# Patient Record
Sex: Male | Born: 1945 | Race: Black or African American | Hispanic: No | Marital: Single | Smoking: Former smoker
Health system: Southern US, Community
[De-identification: ages and names within clinical notes are randomized; demographics above are authoritative.]

## PROBLEM LIST (undated history)

## (undated) DIAGNOSIS — F32A Depression, unspecified: Secondary | ICD-10-CM

## (undated) DIAGNOSIS — F4310 Post-traumatic stress disorder, unspecified: Secondary | ICD-10-CM

## (undated) DIAGNOSIS — Z87891 Personal history of nicotine dependence: Secondary | ICD-10-CM

## (undated) DIAGNOSIS — R05 Cough: Secondary | ICD-10-CM

## (undated) DIAGNOSIS — R053 Chronic cough: Secondary | ICD-10-CM

## (undated) DIAGNOSIS — F329 Major depressive disorder, single episode, unspecified: Secondary | ICD-10-CM

## (undated) HISTORY — PX: NO PAST SURGERIES: SHX2092

## (undated) HISTORY — DX: Major depressive disorder, single episode, unspecified: F32.9

## (undated) HISTORY — DX: Post-traumatic stress disorder, unspecified: F43.10

## (undated) HISTORY — DX: Cough: R05

## (undated) HISTORY — DX: Chronic cough: R05.3

## (undated) HISTORY — DX: Personal history of nicotine dependence: Z87.891

## (undated) HISTORY — DX: Depression, unspecified: F32.A

---

## 2001-03-18 ENCOUNTER — Ambulatory Visit (HOSPITAL_COMMUNITY): Admission: RE | Admit: 2001-03-18 | Discharge: 2001-03-18 | Payer: Self-pay | Admitting: Orthopedic Surgery

## 2001-03-18 ENCOUNTER — Encounter: Payer: Self-pay | Admitting: Orthopedic Surgery

## 2002-03-28 ENCOUNTER — Encounter: Payer: Self-pay | Admitting: Neurology

## 2002-03-28 ENCOUNTER — Encounter: Admission: RE | Admit: 2002-03-28 | Discharge: 2002-03-28 | Payer: Self-pay | Admitting: Neurology

## 2002-05-04 ENCOUNTER — Ambulatory Visit (HOSPITAL_BASED_OUTPATIENT_CLINIC_OR_DEPARTMENT_OTHER): Admission: RE | Admit: 2002-05-04 | Discharge: 2002-05-04 | Payer: Self-pay | Admitting: Internal Medicine

## 2003-01-28 ENCOUNTER — Ambulatory Visit (HOSPITAL_BASED_OUTPATIENT_CLINIC_OR_DEPARTMENT_OTHER): Admission: RE | Admit: 2003-01-28 | Discharge: 2003-01-28 | Payer: Self-pay | Admitting: Pulmonary Disease

## 2004-02-28 ENCOUNTER — Ambulatory Visit: Payer: Self-pay | Admitting: Internal Medicine

## 2004-04-18 ENCOUNTER — Ambulatory Visit: Payer: Self-pay | Admitting: Internal Medicine

## 2004-05-03 ENCOUNTER — Ambulatory Visit: Payer: Self-pay | Admitting: Internal Medicine

## 2004-05-18 ENCOUNTER — Ambulatory Visit: Payer: Self-pay | Admitting: Internal Medicine

## 2004-05-25 ENCOUNTER — Ambulatory Visit (HOSPITAL_COMMUNITY): Admission: RE | Admit: 2004-05-25 | Discharge: 2004-05-25 | Payer: Self-pay | Admitting: Internal Medicine

## 2004-07-18 ENCOUNTER — Ambulatory Visit: Payer: Self-pay | Admitting: Internal Medicine

## 2004-07-20 ENCOUNTER — Ambulatory Visit (HOSPITAL_COMMUNITY): Admission: RE | Admit: 2004-07-20 | Discharge: 2004-07-20 | Payer: Self-pay | Admitting: Internal Medicine

## 2005-01-18 ENCOUNTER — Ambulatory Visit: Payer: Self-pay | Admitting: Internal Medicine

## 2005-02-07 ENCOUNTER — Ambulatory Visit: Payer: Self-pay | Admitting: Pulmonary Disease

## 2005-03-15 ENCOUNTER — Ambulatory Visit: Payer: Self-pay | Admitting: Pulmonary Disease

## 2005-04-17 ENCOUNTER — Ambulatory Visit: Payer: Self-pay | Admitting: Internal Medicine

## 2005-05-03 ENCOUNTER — Ambulatory Visit: Payer: Self-pay | Admitting: Endocrinology

## 2005-10-02 ENCOUNTER — Ambulatory Visit: Payer: Self-pay | Admitting: Internal Medicine

## 2005-11-22 ENCOUNTER — Ambulatory Visit: Payer: Self-pay | Admitting: Internal Medicine

## 2005-11-28 ENCOUNTER — Ambulatory Visit: Payer: Self-pay | Admitting: Internal Medicine

## 2005-12-20 ENCOUNTER — Ambulatory Visit: Payer: Self-pay | Admitting: Internal Medicine

## 2006-05-21 ENCOUNTER — Ambulatory Visit: Payer: Self-pay | Admitting: Internal Medicine

## 2006-10-31 DIAGNOSIS — E785 Hyperlipidemia, unspecified: Secondary | ICD-10-CM | POA: Insufficient documentation

## 2006-10-31 DIAGNOSIS — R51 Headache: Secondary | ICD-10-CM

## 2006-10-31 DIAGNOSIS — F3289 Other specified depressive episodes: Secondary | ICD-10-CM | POA: Insufficient documentation

## 2006-10-31 DIAGNOSIS — F329 Major depressive disorder, single episode, unspecified: Secondary | ICD-10-CM

## 2006-10-31 DIAGNOSIS — M109 Gout, unspecified: Secondary | ICD-10-CM

## 2006-10-31 DIAGNOSIS — R519 Headache, unspecified: Secondary | ICD-10-CM | POA: Insufficient documentation

## 2007-04-23 ENCOUNTER — Ambulatory Visit: Payer: Self-pay | Admitting: Internal Medicine

## 2007-04-23 DIAGNOSIS — M79609 Pain in unspecified limb: Secondary | ICD-10-CM

## 2007-04-23 DIAGNOSIS — J309 Allergic rhinitis, unspecified: Secondary | ICD-10-CM | POA: Insufficient documentation

## 2011-12-04 ENCOUNTER — Encounter (HOSPITAL_COMMUNITY): Payer: Self-pay | Admitting: *Deleted

## 2011-12-04 ENCOUNTER — Emergency Department (INDEPENDENT_AMBULATORY_CARE_PROVIDER_SITE_OTHER)
Admission: EM | Admit: 2011-12-04 | Discharge: 2011-12-04 | Disposition: A | Discharge status: Home / Self Care | Payer: Federal, State, Local not specified - PPO | Source: Home / Self Care | Attending: Emergency Medicine | Admitting: Emergency Medicine

## 2011-12-04 DIAGNOSIS — M25519 Pain in unspecified shoulder: Secondary | ICD-10-CM

## 2011-12-04 DIAGNOSIS — S239XXA Sprain of unspecified parts of thorax, initial encounter: Secondary | ICD-10-CM

## 2011-12-04 DIAGNOSIS — S29012A Strain of muscle and tendon of back wall of thorax, initial encounter: Secondary | ICD-10-CM

## 2011-12-04 DIAGNOSIS — M25511 Pain in right shoulder: Secondary | ICD-10-CM

## 2011-12-04 LAB — POCT I-STAT, CHEM 8
BUN: 12 mg/dL (ref 6–23)
Calcium, Ion: 1.19 mmol/L (ref 1.13–1.30)
Chloride: 101 mEq/L (ref 96–112)
Creatinine, Ser: 0.9 mg/dL (ref 0.50–1.35)
Glucose, Bld: 107 mg/dL — ABNORMAL HIGH (ref 70–99)
HCT: 44 % (ref 39.0–52.0)
Hemoglobin: 15 g/dL (ref 13.0–17.0)
Potassium: 3.8 mEq/L (ref 3.5–5.1)
Sodium: 141 mEq/L (ref 135–145)
TCO2: 27 mmol/L (ref 0–100)

## 2011-12-04 MED ORDER — PREDNISONE 20 MG PO TABS
ORAL_TABLET | ORAL | Status: AC
  Filled 2011-12-04: qty 3

## 2011-12-04 MED ORDER — CELECOXIB 200 MG PO CAPS: 200.0000 mg | ORAL_CAPSULE | Freq: Two times a day (BID) | ORAL | Status: AC

## 2011-12-04 MED ORDER — OMEPRAZOLE 20 MG PO CPDR: 20.0000 mg | DELAYED_RELEASE_CAPSULE | Freq: Every day | ORAL | Status: DC

## 2011-12-04 MED ORDER — PREDNISONE 50 MG PO TABS: 50.0000 mg | ORAL_TABLET | Freq: Every day | ORAL | Status: DC

## 2011-12-04 MED ORDER — PREDNISONE 20 MG PO TABS
60.0000 mg | ORAL_TABLET | Freq: Once | ORAL | Status: AC
  Administered 2011-12-04: 60 mg via ORAL

## 2011-12-04 MED ORDER — KETOROLAC TROMETHAMINE 60 MG/2ML IM SOLN
60.0000 mg | Freq: Once | INTRAMUSCULAR | Status: AC
  Administered 2011-12-04: 60 mg via INTRAMUSCULAR

## 2011-12-04 MED ORDER — KETOROLAC TROMETHAMINE 60 MG/2ML IM SOLN
INTRAMUSCULAR | Status: AC
  Filled 2011-12-04: qty 2

## 2011-12-04 NOTE — ED Notes (Signed)
1 1/2 months of right shoulder pain--denies recent injury

## 2011-12-04 NOTE — ED Provider Notes (Signed)
Medical screening examination/treatment/procedure(s) were performed by non-physician practitioner and as supervising physician I was immediately available for consultation/collaboration.  Luiz Blare MD   Luiz Blare, MD 12/04/11 [phone]

## 2011-12-04 NOTE — ED Provider Notes (Signed)
History     CSN: [id number]  Arrival date & time 12/04/11  1609   None     Chief Complaint  Patient presents with  . Shoulder Pain    (Consider location/radiation/quality/duration/timing/severity/associated sxs/prior treatment) The history is provided by the patient.   David Wilkins is a 66 y.o. male who complains of right shoulder pain for one month.  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  Symptoms have been persistent, minimal improvement with home OTC pain reliever.  Denies prior history of related problems.    History reviewed. No pertinent past medical history.  History reviewed. No pertinent past surgical history.  History reviewed. No pertinent family history.  History  Substance Use Topics  . Smoking status: Former Games developer  . Smokeless tobacco: Not on file  . Alcohol Use: Yes     occasionaly      Review of Systems  Constitutional: Negative.   Respiratory: Negative.   Cardiovascular: Negative.   Musculoskeletal: Positive for arthralgias. Negative for myalgias, back pain, joint swelling and gait problem.  Neurological: Negative.     Allergies  Review of patient's allergies indicates no known allergies.  Home Medications   Current Outpatient Rx  Name Route Sig Dispense Refill  . CELECOXIB 200 MG PO CAPS Oral Take 1 capsule (200 mg total) by mouth 2 (two) times daily. 30 capsule 1  . OMEPRAZOLE 20 MG PO CPDR Oral Take 1 capsule (20 mg total) by mouth daily. 30 capsule 1  . PREDNISONE 50 MG PO TABS Oral Take 1 tablet (50 mg total) by mouth daily. 4 tablet 0    BP 121/77  Pulse 78  Temp 98.4 F (36.9 C) (Oral)  Resp 20  SpO2 96%  Physical Exam  Nursing note and vitals reviewed. Constitutional: He is oriented to person, place, and time. Vital signs are normal. He appears well-developed and well-nourished. He is active and cooperative.  HENT:  Head: Normocephalic.  Eyes:  Conjunctivae are normal. Pupils are equal, round, and reactive to light. No scleral icterus.  Neck: Trachea normal. Neck supple.  Cardiovascular: Normal rate, regular rhythm, normal heart sounds and intact distal pulses.   Pulmonary/Chest: Effort normal and breath sounds normal. No respiratory distress. He has no wheezes. He has no rales. He exhibits no tenderness.  Musculoskeletal:       Right shoulder: Normal.       Left shoulder: Normal.       Cervical back: Normal.       Thoracic back: Normal.       Back:       Tenderness upon palpation to the left of right scapula  Lymphadenopathy:    He has no cervical adenopathy.  Neurological: He is alert and oriented to person, place, and time. He has normal strength. No cranial nerve deficit or sensory deficit. Coordination and gait normal. GCS eye subscore is 4. GCS verbal subscore is 5. GCS motor subscore is 6.  Skin: Skin is warm and dry.  Psychiatric: He has a normal mood and affect. His speech is normal and behavior is normal. Judgment and thought content normal. Cognition and memory are normal.    ED Course  Procedures (including critical care time)  Labs Reviewed  POCT I-STAT, CHEM 8 - Abnormal; Notable for the following:    Glucose, Bld 107 (*)     All other components within normal limits   No results found.  1. Rhomboid muscle strain   2. Right shoulder pain       MDM  Continue with applying heat.  Take medication as prescribed.  No heavy lifting for one week.  Use proper lifting techniques.  Return if symptoms are not improved or if symptoms worsen.          Johnsie Kindred, NP 12/04/11 2053

## 2012-06-03 ENCOUNTER — Encounter (HOSPITAL_COMMUNITY): Payer: Self-pay | Admitting: *Deleted

## 2012-06-03 ENCOUNTER — Emergency Department (HOSPITAL_COMMUNITY)
Admission: EM | Admit: 2012-06-03 | Discharge: 2012-06-03 | Disposition: A | Discharge status: Home / Self Care | Payer: Federal, State, Local not specified - PPO | Source: Home / Self Care

## 2012-06-03 DIAGNOSIS — H10029 Other mucopurulent conjunctivitis, unspecified eye: Secondary | ICD-10-CM

## 2012-06-03 MED ORDER — TOBRAMYCIN-DEXAMETHASONE 0.3-0.1 % OP OINT: TOPICAL_OINTMENT | Freq: Four times a day (QID) | OPHTHALMIC | Status: DC

## 2012-06-03 NOTE — ED Provider Notes (Signed)
History     CSN: [id number]  Arrival date & time 06/03/12  1647   First MD Initiated Contact with Patient 06/03/12 1709      Chief Complaint  Patient presents with  . Conjunctivitis    (Consider location/radiation/quality/duration/timing/severity/associated sxs/prior treatment) HPI Comments: 67 year old man presents with redness, drainage ,swelling and purulent exudates in the left eye. It started late yesterday afternoon. States it is worse when he lies down. He denies any known injury.he states there is minimal blurring in the left eye as compared to the normal light. No foreign body sensation, no photophobia.  Patient is a 67 y.o. male presenting with conjunctivitis.  Conjunctivitis  Associated symptoms include congestion, eye discharge and eye redness. Pertinent negatives include no ear discharge, no rhinorrhea, no sore throat and no neck pain.    History reviewed. No pertinent past medical history.  History reviewed. No pertinent past surgical history.  History reviewed. No pertinent family history.  History  Substance Use Topics  . Smoking status: Former Games developer  . Smokeless tobacco: Not on file  . Alcohol Use: Yes     Comment: occasionaly      Review of Systems  Constitutional: Negative.   HENT: Positive for congestion. Negative for sore throat, facial swelling, rhinorrhea, neck pain and ear discharge.   Eyes: Positive for discharge and redness.  Gastrointestinal: Negative.   Skin: Negative.   Psychiatric/Behavioral: Negative.     Allergies  Review of patient's allergies indicates no known allergies.  Home Medications   Current Outpatient Rx  Name  Route  Sig  Dispense  Refill  . omeprazole (PRILOSEC) 20 MG capsule   Oral   Take 1 capsule (20 mg total) by mouth daily.   30 capsule   1   . predniSONE (DELTASONE) 50 MG tablet   Oral   Take 1 tablet (50 mg total) by mouth daily.   4 tablet   0   . tobramycin-dexamethasone (TOBRADEX) ophthalmic  ointment   Left Eye   Place into the left eye every 6 (six) hours.   3.5 g   0     BP 118/72  Pulse 72  Temp(Src) 98.6 F (37 C) (Oral)  SpO2 100%  Physical Exam  Nursing note and vitals reviewed. Constitutional: He is oriented to person, place, and time. He appears well-developed. No distress.  HENT:  Mouth/Throat: Oropharynx is clear and moist. No oropharyngeal exudate.  Eyes: EOM are normal. Pupils are equal, round, and reactive to light.  Conjunctiva red, and puffy upper and lower eyelids. Sclera is injected. There is a mucopurulent discharge from the eye.  Neck: Neck supple.  Cardiovascular: Normal rate.   Pulmonary/Chest: Effort normal.  Lymphadenopathy:    He has no cervical adenopathy.  Neurological: He is alert and oriented to person, place, and time.  Skin: Skin is warm and dry.  Psychiatric: He has a normal mood and affect.    ED Course  Procedures (including critical care time)  Labs Reviewed - No data to display No results found.   1. Acute bacterial conjunctivitis, left       MDM  Warm compresses to the eye several times during the day and upon awakening in the morning. They should get the cough that you are using is washed repeatedly. Wash your hands frequently. TobraDex ointment in the lower lid of the IV every 6 hours. He started getting symptoms in the right I start using the ointment the same way every 6 hours. You develop new  symptoms or getting worse may return otherwise she can follow up with your primary care doctor.        Hayden Rasmussen, NP 06/03/12 1815

## 2012-06-03 NOTE — ED Notes (Signed)
Pt  States  He     Woke  Up  With  Red irritated    l  Eye      2  Days  Ago   He   denys  Any  Other  Symptoms

## 2012-06-05 NOTE — ED Provider Notes (Signed)
Medical screening examination/treatment/procedure(s) were performed by resident physician or non-physician practitioner and as supervising physician I was immediately available for consultation/collaboration.   Myeesha Shane DOUGLAS MD.   Fabion Gatson D Gerrald Basu, MD 06/05/12 1003 

## 2012-10-30 ENCOUNTER — Encounter: Payer: Self-pay | Admitting: Medical

## 2012-10-30 ENCOUNTER — Ambulatory Visit (INDEPENDENT_AMBULATORY_CARE_PROVIDER_SITE_OTHER): Payer: Federal, State, Local not specified - PPO | Admitting: Medical

## 2012-10-30 VITALS — BP 130/90 | HR 100 | Temp 98.1°F | Resp 16 | Wt 191.0 lb

## 2012-10-30 DIAGNOSIS — R05 Cough: Secondary | ICD-10-CM

## 2012-10-30 DIAGNOSIS — Z87891 Personal history of nicotine dependence: Secondary | ICD-10-CM

## 2012-10-30 MED ORDER — OMEPRAZOLE 20 MG PO CPDR: 20.0000 mg | DELAYED_RELEASE_CAPSULE | Freq: Every day | ORAL | Status: DC

## 2012-10-30 NOTE — Progress Notes (Signed)
Subjective: Was seeing [facility] prior for care.  He is a [place] Veteran.  He lives alone and going to [facility] is costly.  Wants care closer to home.   Here for cough.  Has had cough for a couple of years.   Most of the time when he puts something in his mouth, will have a break out of uncontrollable cough, sneezing.  He brought a sucker to demonstrate this today.   Placing a dum dum sucker in his mouth prompted coughing fit about 2 minutes later.   He notes that [place] doctors were not sure what has been causing his cough.  He was referred to pulmonology, but hasn't went yet.  Last CXR not sure, but thinks it was in the last several months.  Last PFT several months ago.   Denies itchy and watery eyes, no sore throat, no ear pain, no hemoptysis, no abdominal pain, no NVD, no fever, no weight loss, no chest pain.   Has been prescribed inhalers, pills, not sure what the medications were.     Past Medical History  Diagnosis Date  . Depression   . Former smoker    ROS as in subjective  Objective: Filed Vitals:   10/30/12 1140  BP: 130/90  Pulse: 100  Temp: 98.1 F (36.7 C)  Resp: 16    General appearance: alert, no distress, WD/WN, pleasant AA male HEENT: normocephalic, sclerae anicteric, TMs pearly, nares patent, no discharge or erythema, pharynx normal Oral cavity: MMM, no lesions Neck: supple, no lymphadenopathy, no thyromegaly, no masses Heart: RRR, normal S1, S2, no murmurs Lungs: CTA bilaterally, no wheezes, rhonchi, or rales Abdomen: +bs, soft, non tender, non distended, no masses, no hepatomegaly, no splenomegaly Pulses: 2+ symmetric, upper and lower extremities, normal cap refill Ext: no edema   Assessment: Encounter Diagnoses  Name Primary?  . Chronic cough Yes  . Former smoker     Plan: We will request prior records, CXR, PFTs from the [place].  For now, begin Zyrtec at bedtime.  Begin Omeprazole daily in the morning.  Discussed possible causes of chronic cough.  Follow-up 2  wk, pending records.

## 2012-10-30 NOTE — Patient Instructions (Addendum)
Begin Zyrtec 10mg  OTC at bedtime for possible allergies.   Begin Omeprazole20mg  tablet daily 45 minutes before breakfast for possible acid reflux.  Lets get your records from the [place].  I would like to see you back in 2 weeks.     Cough, Adult  A cough is a reflex that helps clear your throat and airways. It can help heal the body or may be a reaction to an irritated airway. A cough may only last 2 or 3 weeks (acute) or may last more than 8 weeks (chronic).  CAUSES Acute cough:  Viral or bacterial infections. Chronic cough:  Infections.  Allergies.  Asthma.  Post-nasal drip.  Smoking.  Heartburn or acid reflux.  Some medicines.  Chronic lung problems (COPD).  Cancer. SYMPTOMS   Cough.  Fever.  Chest pain.  Increased breathing rate.  High-pitched whistling sound when breathing (wheezing).  Colored mucus that you cough up (sputum). TREATMENT   A bacterial cough may be treated with antibiotic medicine.  A viral cough must run its course and will not respond to antibiotics.  Your caregiver may recommend other treatments if you have a chronic cough. HOME CARE INSTRUCTIONS   Only take over-the-counter or prescription medicines for pain, discomfort, or fever as directed by your caregiver. Use cough suppressants only as directed by your caregiver.  Use a cold steam vaporizer or humidifier in your bedroom or home to help loosen secretions.  Sleep in a semi-upright position if your cough is worse at night.  Rest as needed.  Stop smoking if you smoke. SEEK IMMEDIATE MEDICAL CARE IF:   You have pus in your sputum.  Your cough starts to worsen.  You cannot control your cough with suppressants and are losing sleep.  You begin coughing up blood.  You have difficulty breathing.  You develop pain which is getting worse or is uncontrolled with medicine.  You have a fever. MAKE SURE YOU:   Understand these instructions.  Will watch your condition.  Will  get help right away if you are not doing well or get worse. Document Released: 09/21/2010 Document Revised: 06/17/2011 Document Reviewed: 09/21/2010 [facility] Patient Information 2014 [place], [place].

## 2012-11-02 ENCOUNTER — Telehealth: Payer: Self-pay | Admitting: Internal Medicine

## 2012-11-02 NOTE — Telephone Encounter (Signed)
Pt notified of shane's recommendations and will call back in 2 weeks to follow-up

## 2012-11-02 NOTE — Telephone Encounter (Signed)
Message copied by Joslyn Hy on Mon Nov 02, 2012 12:35 PM ------      Message from: Jac Canavan      Created: Mon Nov 02, 2012  7:23 AM       I reviewed his prior medical records.  At this point, his last chest xray a year ago was ok.   He has had prior bronchoscopy of his lungs.               I recommend he begin the Omeprazole/Prilosec prescription once daily in the morning 45 min before breakfast, begin Zyrtec 10mg  at bedtime and f/u in 2wk. ------

## 2012-11-05 ENCOUNTER — Encounter: Payer: Self-pay | Admitting: Medical

## 2012-11-11 ENCOUNTER — Ambulatory Visit (INDEPENDENT_AMBULATORY_CARE_PROVIDER_SITE_OTHER): Payer: Federal, State, Local not specified - PPO | Admitting: Medical

## 2012-11-11 ENCOUNTER — Encounter: Payer: Self-pay | Admitting: Medical

## 2012-11-11 VITALS — BP 118/80 | HR 80 | Temp 98.6°F | Resp 16 | Wt 192.0 lb

## 2012-11-11 DIAGNOSIS — R05 Cough: Secondary | ICD-10-CM

## 2012-11-11 DIAGNOSIS — R21 Rash and other nonspecific skin eruption: Secondary | ICD-10-CM

## 2012-11-11 MED ORDER — TRIAMCINOLONE ACETONIDE 0.1 % EX CREA: TOPICAL_CREAM | Freq: Two times a day (BID) | CUTANEOUS | Status: DC

## 2012-11-11 NOTE — Progress Notes (Signed)
Subjective: Here for recheck on chronic cough. Was seeing [facility] prior for care.  He is a [place] Veteran.  He lives alone.  Here for cough.  Has had cough for a couple of years.   Most of the time when he puts something in his mouth, will have a break out of uncontrollable cough, sneezing.  He notes that [place] doctors were not sure what has been causing his cough.  He has seen pulmonology prior at the [place].  He [facility], but hasn't went yet.  Last CXR not sure, but thinks it was in the last several months.  Last PFT several months ago.   Denies itchy and watery eyes, no sore throat, no ear pain, no hemoptysis, no abdominal pain, no NVD, no fever, no weight loss, no chest pain.   Has been prescribed inhalers, pills, not sure what the medications were.    Since last visit he did begin both Omeprazole and Zyrtec which gave about 40% improvement.    He notes hx/o rash.  Gets this intermittent.  Can be itchy.  Can flare from time to time for many years.  No one has every diagnosed it.  It comes and goes.  Wonders if it is due to exposures back in [place].  Past Medical History  Diagnosis Date  . Depression   . Former smoker   . Chronic cough    ROS as in subjective  Objective: Filed Vitals:   11/11/12 1538  BP: 118/80  Pulse: 80  Temp: 98.6 F (37 C)  Resp: 16    General appearance: alert, no distress, WD/WN, pleasant AA male HEENT: normocephalic, sclerae anicteric, TMs pearly, nares patent, no discharge or erythema, pharynx normal Oral cavity: MMM, no lesions Neck: supple, no lymphadenopathy, no thyromegaly, no masses Heart: RRR, normal S1, S2, no murmurs Lungs: CTA bilaterally, no wheezes, rhonchi, or rales Abdomen: +bs, soft, non tender, non distended, no masses, no hepatomegaly, no splenomegaly Pulses: 2+ symmetric, upper and lower extremities, normal cap refill Ext: no edema Skin: few scattered roundish patches of rough skin, slight red/brown  appearance, on left hand, left lower leg, suggestive of nummular eczema  Assessment: Encounter Diagnoses  Name Primary?  . Chronic cough Yes  . Rash and nonspecific skin eruption     Plan: Reviewed prior records including normal CXR 1 year ago, chart records showing prior negative bronchoscopy, documented longstanding chronic cough.  I didn't get very much prior records.   He has had some improvement on Zyrtec and Omeprazole which he will continue.    We will refer to local pulmonologist here for further evaluation.  Rash - suggestive of nummular eczema.   Begin triamcinolone cream.  C/t daily lotion.   If not improving in 1-2 wk, return for biopsy.   F/u pending referral

## 2012-11-17 ENCOUNTER — Telehealth: Payer: Self-pay | Admitting: Family Medicine

## 2012-11-17 NOTE — Telephone Encounter (Signed)
Message copied by Janeice Robinson on Tue Nov 17, 2012  1:51 PM ------      Message from: Jac Canavan      Created: Wed Nov 11, 2012  9:57 PM       Refer to pulmonology for chronic cough.  Refer to Dr. Shelle Iron ------

## 2012-11-17 NOTE — Telephone Encounter (Signed)
Patient is aware of his appointment to see Dr. Shelle Iron on 12/21/12 @ 245 pm. CLS

## 2012-11-18 ENCOUNTER — Telehealth: Payer: Self-pay | Admitting: Family Medicine

## 2012-11-18 NOTE — Telephone Encounter (Signed)
Patient is aware of his appointment to see Dr. Shelle Iron at [facility] on 12/21/12 @ 245 pm. CLS

## 2012-12-02 ENCOUNTER — Ambulatory Visit (INDEPENDENT_AMBULATORY_CARE_PROVIDER_SITE_OTHER): Payer: Federal, State, Local not specified - PPO | Admitting: Medical

## 2012-12-02 ENCOUNTER — Encounter: Payer: Self-pay | Admitting: Medical

## 2012-12-02 VITALS — BP 100/70 | HR 92 | Temp 97.6°F | Resp 16 | Wt 196.0 lb

## 2012-12-02 DIAGNOSIS — M79609 Pain in unspecified limb: Secondary | ICD-10-CM

## 2012-12-02 DIAGNOSIS — M79645 Pain in left finger(s): Secondary | ICD-10-CM

## 2012-12-02 DIAGNOSIS — M65839 Other synovitis and tenosynovitis, unspecified forearm: Secondary | ICD-10-CM

## 2012-12-02 DIAGNOSIS — M25542 Pain in joints of left hand: Secondary | ICD-10-CM

## 2012-12-02 DIAGNOSIS — M659 Synovitis and tenosynovitis, unspecified: Secondary | ICD-10-CM

## 2012-12-02 DIAGNOSIS — M25549 Pain in joints of unspecified hand: Secondary | ICD-10-CM

## 2012-12-02 MED ORDER — NAPROXEN SODIUM ER 500 MG PO TB24: 500.0000 mg | ORAL_TABLET | Freq: Every day | ORAL | Status: DC

## 2012-12-02 NOTE — Patient Instructions (Signed)
Thumb pain, inflammation and tenosynovitis.  Begin Naprelan 750mg , 1 tablet daily for 2 days, then Use Naprelan 500mg , 1 tablet daily for 4 days  Use ice for swelling and pain  Use the Thumb Spica Splint for compression and rest  Rest the hand.  If not improving by mid next week, then let me know.

## 2012-12-02 NOTE — Progress Notes (Signed)
Subjective: Here for left thumb pain.   He was using a long handle brush to clean his siding Saturday 4 days ago.   Since then has had pain and swelling of left thumb, decreased ROM.  Denies trauma or other injury.   No other c/o.   Using nothing for symptoms.  No weakness, numbness, tingling.   ROS as in subjective  Objective: Gen: wd, wn, nad Skin: mild generalized swelling of left thumb MCP, no warmth or erythema MSK: swelling and tenderness at left thumb MCP, decreased ROM due to pain and swelling, otherwise no deformity, rest of hand and fingers nontender and normal ROM Thumb neurovascularly intact  Assessment: Encounter Diagnoses  Name Primary?  . Thumb pain, left Yes  . Tenosynovitis of thumb   . Arthralgia of hand, left    Plan: Left thumb with pain swelling, tenosynovitis.  Begin thumb spica splint, ice, rest, short course of daily Naprelan samples x 5 days, and recheck if not much improved by mid next week.

## 2012-12-21 ENCOUNTER — Encounter: Payer: Self-pay | Admitting: Pulmonary Disease

## 2012-12-21 ENCOUNTER — Ambulatory Visit (INDEPENDENT_AMBULATORY_CARE_PROVIDER_SITE_OTHER): Payer: Federal, State, Local not specified - PPO | Admitting: Pulmonary Disease

## 2012-12-21 VITALS — BP 110/80 | HR 80 | Temp 97.4°F | Ht 71.0 in | Wt 190.2 lb

## 2012-12-21 DIAGNOSIS — R05 Cough: Secondary | ICD-10-CM

## 2012-12-21 MED ORDER — OMEPRAZOLE 40 MG PO CPDR: 40.0000 mg | DELAYED_RELEASE_CAPSULE | Freq: Two times a day (BID) | ORAL | Status: DC

## 2012-12-21 MED ORDER — BENZONATATE 100 MG PO CAPS: 200.0000 mg | ORAL_CAPSULE | Freq: Four times a day (QID) | ORAL | Status: DC | PRN

## 2012-12-21 NOTE — Addendum Note (Signed)
Addended by: Darrell Jewel on: 12/21/2012 04:16 PM   Modules accepted: Orders

## 2012-12-21 NOTE — Assessment & Plan Note (Signed)
The patient has a chronic cough of over 2 years duration, and most likely based on his history, it is upper airway in origin.  He is having nasal sniffling during our visit, as well as frequent throat clearing.  He also describes a classic globus sensation.  His spirometry today is unremarkable.  At this point, I would like to treat him aggressively for postnasal drip as well as laryngo-pharyngeal reflux.  I have also discussed with him the behavioral therapies for cyclical coughing.

## 2012-12-21 NOTE — Progress Notes (Signed)
  Subjective:    Patient ID: David Wilkins, male    DOB: [birth year], 67 y.o.   MRN: [id number]  HPI The patient is a 67 year old male who been asked to see for chronic cough.  The patient states he's had this for at least 2 years, and feels that it has somewhat worsened.  It is dry and hacking in nature, and is worse upon lying down.  The patient clearly has rhinorrhea symptoms, but is unsure if he has had postnasal drip.  He denies reflux symptoms.  He describes a classic globus sensation, and will clear his throat frequently.  He denies any shortness of breath, and has had a recent chest x-ray at the [facility] and was told that it was unremarkable.  He does have a history of smoking one pack per day for 18 years, but has not smoked since 1984.   Review of Systems  Constitutional: Negative for fever and unexpected weight change.  HENT: Positive for sneezing. Negative for ear pain, nosebleeds, congestion, sore throat, rhinorrhea, trouble swallowing, dental problem, postnasal drip and sinus pressure.   Eyes: Negative for redness and itching.  Respiratory: Positive for cough. Negative for chest tightness, shortness of breath and wheezing.   Cardiovascular: Negative for palpitations and leg swelling.  Gastrointestinal: Negative for nausea and vomiting.  Genitourinary: Negative for dysuria.  Musculoskeletal: Negative for joint swelling.  Skin: Positive for rash.  Neurological: Positive for headaches.  Hematological: Does not bruise/bleed easily.  Psychiatric/Behavioral: Positive for dysphoric mood. The patient is not nervous/anxious.        Objective:   Physical Exam Constitutional:  Well developed, no acute distress  HENT:  Nares patent without discharge  Oropharynx without exudate, palate and uvula are normal  Eyes:  Perrla, eomi, no scleral icterus  Neck:  No JVD, no TMG  Cardiovascular:  Normal rate, regular rhythm, no rubs or gallops.  No murmurs        Intact distal  pulses  Pulmonary :  Normal breath sounds, no stridor or respiratory distress   No rales, rhonchi, or wheezing  Abdominal:  Soft, nondistended, bowel sounds present.  No tenderness noted.   Musculoskeletal:  No lower extremity edema noted.  Lymph Nodes:  No cervical lymphadenopathy noted  Skin:  No cyanosis noted  Neurologic:  Alert, appropriate, moves all 4 extremities without obvious deficit.         Assessment & Plan:

## 2012-12-21 NOTE — Patient Instructions (Addendum)
Will treat you with omeprazole 40mg  one every am and pm for next 3-4 weeks. Chlorpheniramine 4mg  over the counter, and take 2 at bedtime and one at lunch for next 3-4 weeks. Tessalon pearls 100mg , take 2 every 6 hrs if needed for cough.  Use hard candy, no mint or cough drops, to bathe the back of your throat and keep you from throat clearing. NO THROAT CLEARING!  Your cough will not go away until this is done.  followup with me in 4 weeks to check on your progress.

## 2013-01-13 ENCOUNTER — Ambulatory Visit (INDEPENDENT_AMBULATORY_CARE_PROVIDER_SITE_OTHER): Payer: Federal, State, Local not specified - PPO | Admitting: Family Medicine

## 2013-01-13 ENCOUNTER — Encounter: Payer: Self-pay | Admitting: Family Medicine

## 2013-01-13 VITALS — BP 130/82 | HR 71 | Temp 98.2°F

## 2013-01-13 DIAGNOSIS — J209 Acute bronchitis, unspecified: Secondary | ICD-10-CM

## 2013-01-13 MED ORDER — AMOXICILLIN 875 MG PO TABS: 875.0000 mg | ORAL_TABLET | Freq: Two times a day (BID) | ORAL | Status: DC

## 2013-01-13 NOTE — Progress Notes (Signed)
  Subjective:    Patient ID: David Wilkins, male    DOB: [birth year], 67 y.o.   MRN: [id number]  HPI Approximately 4 days ago he started having difficulty with sneezing, coughing, rhinorrhea, nasal congestion and fatigue. He does not smoke.   Review of Systems     Objective:   Physical Exam alert and slightly toxic appearing. Tympanic membranes and canals are normal. Throat is clear. Tonsils are normal. Neck is supple without adenopathy or thyromegaly. Cardiac exam shows a regular sinus rhythm without murmurs or gallops. Lungs are clear to auscultation.        Assessment & Plan:  Acute bronchitis - Plan: amoxicillin (AMOXIL) 875 MG tablet  although his symptoms seem to be viral in nature, he looks much more toxic than I would expect from a URI. I will therefore treat with an antibiotic

## 2013-01-13 NOTE — Patient Instructions (Signed)
Take all the medicine. If not back to normal when you finish the antibiotic call me. Do not except better

## 2013-01-20 ENCOUNTER — Ambulatory Visit (INDEPENDENT_AMBULATORY_CARE_PROVIDER_SITE_OTHER): Payer: Federal, State, Local not specified - PPO | Admitting: Pulmonary Disease

## 2013-01-20 ENCOUNTER — Encounter: Payer: Self-pay | Admitting: Pulmonary Disease

## 2013-01-20 VITALS — BP 102/64 | HR 62 | Temp 98.4°F | Ht 71.0 in | Wt 185.5 lb

## 2013-01-20 DIAGNOSIS — R05 Cough: Secondary | ICD-10-CM

## 2013-01-20 DIAGNOSIS — J309 Allergic rhinitis, unspecified: Secondary | ICD-10-CM

## 2013-01-20 NOTE — Assessment & Plan Note (Signed)
The patient continues to have a chronic cough that is clearly upper airway in origin.  There is nothing to suggest a pulmonary etiology for this.  He is complaining of rhinorrhea, and is snorting and spitting during our visit.  He has a nasal voice as well.  I would like to intensify his treatment for allergic rhinitis, and we'll try him on dymista.  I also stressed to him the importance of behavioral therapies to prevent cyclical coughing, and that he cannot clear his throat if he expects to get better.  He will followup with me as needed.  If he continues to have issues, he may benefit from consultation with otolaryngology or allergy.

## 2013-01-20 NOTE — Progress Notes (Signed)
  Subjective:    Patient ID: David Wilkins, male    DOB: [birth year], 67 y.o.   MRN: [id number]  HPI The patient comes in today for followup of his chronic cough.  This is felt to be upper airway in origin, and possibly related to postnasal drip, cyclical coughing, and possibly laryngopharyngeal reflux.  Patient on omeprazole Tessalon Perles, reviewed behavioral therapies to help cyclical coughing, and asked him to change Zyrtec to chlorpheniramine.  The patient basically did none of this.  He continues to be on Zyrtec, and did not want to take omeprazole or Tessalon Perles because of fear of drug interactions.  He states his cough is actually improved significantly, and is down to a 4/10.  He continues to have rhinorrhea and postnasal drip, and his snorting throughout our visit.   Review of Systems  Constitutional: Negative for fever and unexpected weight change.  HENT: Negative for congestion, dental problem, ear pain, nosebleeds, postnasal drip, rhinorrhea, sinus pressure, sneezing, sore throat and trouble swallowing.   Eyes: Negative for redness and itching.  Respiratory: Positive for cough. Negative for chest tightness, shortness of breath and wheezing.   Cardiovascular: Negative for palpitations and leg swelling.  Gastrointestinal: Negative for nausea and vomiting.  Genitourinary: Negative for dysuria.  Musculoskeletal: Negative for joint swelling.  Skin: Negative for rash.  Neurological: Negative for headaches.  Hematological: Does not bruise/bleed easily.  Psychiatric/Behavioral: Negative for dysphoric mood. The patient is not nervous/anxious.        Objective:   Physical Exam Well-developed male in no acute distress Nose without purulence or discharge noted.  Erythematous mucosa with weeping turbinate Neck without lymphadenopathy or thyromegaly Lower extremities without edema, no cyanosis Alert and oriented, moves all 4 extremities.       Assessment & Plan:

## 2013-01-20 NOTE — Patient Instructions (Signed)
Ok to stay off tessalon pearls and omeprazole if you are worried about interactions. Start dymista one spray each nostril am and pm. Ok to stay on zyrtec 10mg  each day. NO THROAT CLEARING,  use hard candy during the day to soothe cough, minimize voice use. Please call if your cough does not improve.

## 2013-01-29 ENCOUNTER — Telehealth: Payer: Self-pay | Admitting: Medical

## 2013-01-29 NOTE — Telephone Encounter (Signed)
Pt states that he needs a refill on amoxil. He states that since he stopped the medication his symptoms have returned. He is requesting that he get another round of amoxil he also states that he needs a refill on kenalog. The tube he has is empty and he still has rash and it does help. Pt uses [facility] on [address].

## 2013-01-29 NOTE — Telephone Encounter (Signed)
He just saw pulmonology and they felt like a lot of his symptoms were allergy related.  Is he taking the allergy regimen they recommended?  What symptoms does he have where he thinks he needs another antibiotic?

## 2013-01-29 NOTE — Telephone Encounter (Signed)
Patient is aware of what Kristian Covey  PA-C recommendation are. CLS

## 2013-01-29 NOTE — Telephone Encounter (Signed)
Let me reiterate, if his only symptom is cough, I cannot send an antibiotic for that reason alone.  He just saw a lung doctor for this very reason,which is an expert, and they felt like his cough is related to allergies, not infection, non-pneumonia.  Thus, unless he has other symptoms besides cough, then he needs to use the allergy medications that the pulmonologist recommended. I cannot send out an anabolic based on cough alone without seeing him

## 2013-01-29 NOTE — Telephone Encounter (Signed)
Patient states that he is not taking anything for his allergies. He said that he has a cough and that's why he needs another antibiotic. CLS

## 2013-06-29 ENCOUNTER — Encounter: Payer: Self-pay | Admitting: Family Medicine

## 2013-06-29 ENCOUNTER — Ambulatory Visit (INDEPENDENT_AMBULATORY_CARE_PROVIDER_SITE_OTHER): Payer: Federal, State, Local not specified - PPO | Admitting: Family Medicine

## 2013-06-29 VITALS — BP 100/60 | HR 80 | Wt 195.0 lb

## 2013-06-29 DIAGNOSIS — R05 Cough: Secondary | ICD-10-CM

## 2013-06-29 DIAGNOSIS — R053 Chronic cough: Secondary | ICD-10-CM

## 2013-06-29 DIAGNOSIS — R059 Cough, unspecified: Secondary | ICD-10-CM

## 2013-06-29 MED ORDER — AZELASTINE-FLUTICASONE 137-50 MCG/ACT NA SUSP: NASAL | Status: DC

## 2013-06-29 NOTE — Patient Instructions (Addendum)
Use the nasal spray and Zyrtec. Use cough drops or lozenges. Try not to clear your throat quite as much. Use cortisone cream on that twice a day until it gets under control and then you can back off but keep using it for control

## 2013-06-29 NOTE — Progress Notes (Signed)
   Subjective:    Patient ID: David Wilkins, male    DOB: [birth year], [age] y.o.   MRN: [id number]  HPI He has a 2-1/2 month history of cough, chills. No sore throat, earache, shortness of breath, sneezing, itchy watery eyes, rhinorrhea. He quit smoking in 1986. Review his record indicates he has had difficulty with this in the past and was seen by Dr. Shelle Ironlance.  Review of Systems     Objective:   Physical Exam  alert and in no distress. Tympanic membranes and canals are normal. Throat is clear. Tonsils are normal. Neck is supple without adenopathy or thyromegaly. Cardiac exam shows a regular sinus rhythm without murmurs or gallops. Lungs are clear to auscultation.        Assessment & Plan:  Chronic cough - Plan: Azelastine-Fluticasone 137-50 MCG/ACT SUSP  I reinforced the comments that Dr. Shelle Ironclance made concerning clearing his throat. Renewed his nasal spray. He will call if further difficulties

## 2013-07-21 ENCOUNTER — Telehealth: Payer: Self-pay | Admitting: Family Medicine

## 2013-07-21 MED ORDER — AZELASTINE HCL 0.1 % NA SOLN: 2.0000 | Freq: Two times a day (BID) | NASAL | Status: DC

## 2013-07-21 NOTE — Telephone Encounter (Signed)
Pt came in and stated he was still having same issues as before with the cough. He states he was unable to afford the azelastine that was prescribed for him at last visit. The pharmacy suggested something else otc that might help. Pt states that is hasn't. Pt states that now he has redness and irritation in his eyes occasionally. He states its mainly in his right eye and in the morning. He states he goes away once he gets up. Pt is requesting that he get another medication that is less expensive. [facility] uses [facility] on [address].

## 2013-08-04 ENCOUNTER — Emergency Department (HOSPITAL_COMMUNITY): Payer: Federal, State, Local not specified - PPO

## 2013-08-04 ENCOUNTER — Emergency Department (HOSPITAL_COMMUNITY)
Admission: EM | Admit: 2013-08-04 | Discharge: 2013-08-04 | Disposition: A | Discharge status: Home / Self Care | Payer: Federal, State, Local not specified - PPO | Attending: Emergency Medicine | Admitting: Emergency Medicine

## 2013-08-04 DIAGNOSIS — Z792 Long term (current) use of antibiotics: Secondary | ICD-10-CM | POA: Insufficient documentation

## 2013-08-04 DIAGNOSIS — Y9389 Activity, other specified: Secondary | ICD-10-CM | POA: Insufficient documentation

## 2013-08-04 DIAGNOSIS — S52109A Unspecified fracture of upper end of unspecified radius, initial encounter for closed fracture: Secondary | ICD-10-CM | POA: Insufficient documentation

## 2013-08-04 DIAGNOSIS — Y9289 Other specified places as the place of occurrence of the external cause: Secondary | ICD-10-CM | POA: Insufficient documentation

## 2013-08-04 DIAGNOSIS — S63509A Unspecified sprain of unspecified wrist, initial encounter: Secondary | ICD-10-CM | POA: Insufficient documentation

## 2013-08-04 DIAGNOSIS — W010XXA Fall on same level from slipping, tripping and stumbling without subsequent striking against object, initial encounter: Secondary | ICD-10-CM | POA: Insufficient documentation

## 2013-08-04 DIAGNOSIS — S52123A Displaced fracture of head of unspecified radius, initial encounter for closed fracture: Secondary | ICD-10-CM

## 2013-08-04 DIAGNOSIS — IMO0002 Reserved for concepts with insufficient information to code with codable children: Secondary | ICD-10-CM | POA: Insufficient documentation

## 2013-08-04 DIAGNOSIS — Z8659 Personal history of other mental and behavioral disorders: Secondary | ICD-10-CM | POA: Insufficient documentation

## 2013-08-04 DIAGNOSIS — S63502A Unspecified sprain of left wrist, initial encounter: Secondary | ICD-10-CM

## 2013-08-04 DIAGNOSIS — Z87891 Personal history of nicotine dependence: Secondary | ICD-10-CM | POA: Insufficient documentation

## 2013-08-04 DIAGNOSIS — Z79899 Other long term (current) drug therapy: Secondary | ICD-10-CM | POA: Insufficient documentation

## 2013-08-04 MED ORDER — HYDROCODONE-ACETAMINOPHEN 5-325 MG PO TABS: 1.0000 | ORAL_TABLET | ORAL | Status: AC | PRN

## 2013-08-04 NOTE — Progress Notes (Signed)
Orthopedic Tech Progress Note Patient Details:  David PoundsRonald Wilkins 1945-05-29 [id number]  Ortho Devices Type of Ortho Device: Arm sling;Post (short arm) splint Ortho Device/Splint Interventions: Application   Mickie BailJennifer Carol Cammer 08/04/2013, 3:49 PM

## 2013-08-04 NOTE — ED Notes (Signed)
MD at [facility]

## 2013-08-04 NOTE — Discharge Instructions (Signed)
Wrist Pain Wrist injuries are frequent in adults and children. A sprain is an injury to the ligaments that hold your bones together. A strain is an injury to muscle or muscle cord-like structures (tendons) from stretching or pulling. Generally, when wrists are moderately tender to touch following a fall or injury, a break in the bone (fracture) may be present. Most wrist sprains or strains are better in 3 to 5 days, but complete healing may take several weeks. HOME CARE INSTRUCTIONS   Put ice on the injured area.  Put ice in a plastic bag.  Place a towel between your skin and the bag.  Leave the ice on for 15-20 minutes, 03-04 times a day, for the first 2 days.  Keep your arm raised above the level of your heart whenever possible to reduce swelling and pain.  Rest the injured area for at least 48 hours or as directed by your caregiver.  If a splint or elastic bandage has been applied, use it for as long as directed by your caregiver or until seen by a caregiver for a follow-up exam.  Only take over-the-counter or prescription medicines for pain, discomfort, or fever as directed by your caregiver.  Keep all follow-up appointments. You may need to follow up with a specialist or have follow-up X-rays. Improvement in pain level is not a guarantee that you did not fracture a bone in your wrist. The only way to determine whether or not you have a broken bone is by X-ray. SEEK IMMEDIATE MEDICAL CARE IF:   Your fingers are swollen, very red, white, or cold and blue.  Your fingers are numb or tingling.  You have increasing pain.  You have difficulty moving your fingers. MAKE SURE YOU:   Understand these instructions.  Will watch your condition.  Will get help right away if you are not doing well or get worse. Document Released: 01/02/2005 Document Revised: 06/17/2011 Document Reviewed: 05/16/2010 [facility] Patient Information 2014 [place], [place].  Elbow Fracture, Simple A fracture is  a break in one of the bones.When fractures are not displaced or separated, they may be treated with only a sling or splint. The sling or splint may only be required for two to three weeks. In these cases, often the elbow is put through early range of motion exercises to prevent the elbow from getting stiff. DIAGNOSIS  The diagnosis (learning what is wrong) of a fractured elbow is made by x-ray. These may be required before and after the elbow is put into a splint or cast. X-rays are taken after to make sure the bone pieces have not moved. HOME CARE INSTRUCTIONS   Only take over-the-counter or prescription medicines for pain, discomfort, or fever as directed by your caregiver.  If you have a splint held on with an elastic wrap and your hand or fingers become numb or cold and blue, loosen the wrap and reapply more loosely. See your caregiver if there is no relief.  You may use ice for twenty minutes, four times per day, for the first two to three days.  Use your elbow as directed.  See your caregiver as directed. It is very important to keep all follow-up referrals and appointments in order to avoid any long-term problems with your elbow including chronic pain or stiffness. SEEK IMMEDIATE MEDICAL CARE IF:   There is swelling or increasing pain in elbow.  You begin to lose feeling or experience numbness or tingling in your hand or fingers.  You develop swelling of  the hand and fingers.  You get a cold or blue hand or fingers on affected side. MAKE SURE YOU:   Understand these instructions.  Will watch your condition.  Will get help right away if you are not doing well or get worse. Document Released: 03/19/2001 Document Revised: 06/17/2011 Document Reviewed: 02/07/2009 [facility] Patient Information 2014 [place], [place].

## 2013-08-04 NOTE — ED Notes (Signed)
Returned from xray

## 2013-08-04 NOTE — ED Notes (Signed)
Per PTAR: pt was backing away from a dog, fell backwards and landed on bottom and wrist. Abrasions noted to left elbow, left arm immobilized upon arrival. Denies any blood thinners. Axo x4, skin warm and dry, nad noted.

## 2013-08-04 NOTE — ED Notes (Signed)
othro tech called for splint

## 2013-08-04 NOTE — ED Provider Notes (Signed)
CSN: [id number]     Arrival date & time 08/04/13  1152 History   First MD Initiated Contact with Patient 08/04/13 1158     Chief Complaint  Patient presents with  . Fall     (Consider location/radiation/quality/duration/timing/severity/associated sxs/prior Treatment) HPI Comments: Patient presents with wrist pain. He states the dog was trying to bite him any back away and tripped, falling backward onto his buttocks and braced himself with his left arm. He complains of constant pain around the left wrist. He is left-hand dominant. He has an abrasion on his elbow but denies any underlying pain. He denies any head injury or loss of consciousness. He denies any neck or back pain. He denies any other injuries from the fall.  Patient is a 68 y.o. male presenting with fall.  Fall Pertinent negatives include no headaches.    Past Medical History  Diagnosis Date  . Depression   . Former smoker   . Chronic cough   . PTSD (post-traumatic stress disorder)    Past Surgical History  Procedure Laterality Date  . No past surgeries     No family history on file. History  Substance Use Topics  . Smoking status: Former Smoker -- 1.00 packs/day for 18 years    Types: Cigarettes    Quit date: 04/08/1982  . Smokeless tobacco: Not on file  . Alcohol Use: Yes     Comment: occasionaly    Review of Systems  Constitutional: Negative for fever.  Gastrointestinal: Negative for nausea and vomiting.  Musculoskeletal: Positive for arthralgias and joint swelling. Negative for back pain and neck pain.  Skin: Positive for wound.  Neurological: Negative for weakness, numbness and headaches.      Allergies  Review of patient's allergies indicates no known allergies.  Home Medications   Prior to Admission medications   Medication Sig Start Date End Date Taking? Authorizing Provider  amoxicillin (AMOXIL) 875 MG tablet Take 1 tablet (875 mg total) by mouth 2 (two) times daily. 01/13/13   Ronnald NianJohn C  Lalonde, MD  azelastine (ASTELIN) 137 MCG/SPRAY nasal spray Place 2 sprays into both nostrils 2 (two) times daily. Use in each nostril as directed 07/21/13   Ronnald NianJohn C Lalonde, MD  Azelastine-Fluticasone [phone] MCG/ACT SUSP 2 sprays in each nostril once per day 06/29/13   Ronnald NianJohn C Lalonde, MD  benzonatate (TESSALON) 100 MG capsule Take 2 capsules (200 mg total) by mouth every 6 (six) hours as needed for cough. 12/21/12   Barbaraann ShareKeith M Clance, MD  cholecalciferol (VITAMIN D) 1000 UNITS tablet Take 1,000 Units by mouth daily.    Historical Provider, MD  omeprazole (PRILOSEC) 40 MG capsule Take 1 capsule (40 mg total) by mouth 2 (two) times daily. 12/21/12   Barbaraann ShareKeith M Clance, MD  triamcinolone cream (KENALOG) 0.1 % Apply 1 application topically as needed.    Historical Provider, MD   BP 136/87  Pulse 98  Temp(Src) 99.1 F (37.3 C) (Oral)  Resp 16  SpO2 91% Physical Exam  Constitutional: He is oriented to person, place, and time. He appears well-developed and well-nourished.  HENT:  Head: Normocephalic and atraumatic.  Neck: Normal range of motion. Neck supple.  Cardiovascular: Normal rate.   Pulmonary/Chest: Effort normal.  Musculoskeletal: He exhibits edema and tenderness.  Moderate tenderness to the left wrist. Both on the radial and ulnar side. Mild swelling to the area. There's no bony tenderness the hand or the forearm. He has some abrasions just distal to the left elbow, but there is  no underlying bony tenderness. Neurovascularly intact distally.  Neurological: He is alert and oriented to person, place, and time.  Skin: Skin is warm and dry.  Psychiatric: He has a normal mood and affect.    ED Course  Procedures (including critical care time) Labs Review Labs Reviewed - No data to display  Imaging Review Dg Elbow Complete Left  08/04/2013   CLINICAL DATA:  Fall, elbow pain.  Unable to straighten elbow.  EXAM: LEFT ELBOW - COMPLETE 3+ VIEW  COMPARISON:  None.  FINDINGS: There is a joint effusion  within the left elbow. Possible cortical disruption in the region of the radial neck without well-defined fracture line. Given the joint effusion, findings are suspicious for nondisplaced radial neck fracture. No additional acute bony abnormality seen.  IMPRESSION: Left elbow joint effusion with findings suspicious for nondisplaced radial neck fracture. Consider immobilization and repeat imaging in 1 week.   Electronically Signed   By: Charlett NoseKevin  Dover M.D.   On: 08/04/2013 14:41   Dg Wrist Complete Left  08/04/2013   CLINICAL DATA:  Fall.  Left wrist.  EXAM: LEFT WRIST - COMPLETE 3+ VIEW  COMPARISON:  None.  FINDINGS: The bones are diffusely osteopenic. No acute fracture or dislocation is identified. Mild osteoarthrosis is noted involving the first carpometacarpal joint. No soft tissue abnormality is identified.  IMPRESSION: No evidence of acute osseous abnormality. Mild first CMC joint osteoarthrosis.   Electronically Signed   By: Sebastian AcheAllen  Grady   On: 08/04/2013 13:32     EKG Interpretation None      MDM   Final diagnoses:  Left wrist sprain  Radial head fracture, closed    TDAP UTD, patient was placed in a sugar tong splint by the orthopedic tech. A sling was given to the patient. He was referred to followup with a hand surgeon, Dr. Melvyn Novasrtmann. He denied any for pain medication the ED, but was given a prescription for Vicodin to use as needed.    Rolan BuccoMelanie Decoda Van, MD 08/04/13 [phone]

## 2013-08-04 NOTE — ED Notes (Signed)
Patient transported to X-ray 

## 2013-08-04 NOTE — ED Notes (Signed)
Patient c/o pain in left wrist that radiates to elbow, abrasions on left elbow not actively bleeding, patient's left wrist immobilized and left arm in makeshift shoulder sling.  Sensation, motor intact distal to injury.    Denies getting bit by dog.

## 2013-08-04 NOTE — ED Notes (Signed)
Patient is alert and orientedx4.  Patient was explained discharge instructions and they understood them with no questions.  The patient's friend, Gomez CleverlyJames Spears is taking the patient home.

## 2013-08-05 ENCOUNTER — Encounter: Payer: Self-pay | Admitting: Medical

## 2013-08-05 ENCOUNTER — Ambulatory Visit (INDEPENDENT_AMBULATORY_CARE_PROVIDER_SITE_OTHER): Payer: Federal, State, Local not specified - PPO | Admitting: Medical

## 2013-08-05 VITALS — BP 120/80 | HR 78 | Temp 98.4°F | Resp 18 | Wt 189.0 lb

## 2013-08-05 DIAGNOSIS — M25522 Pain in left elbow: Secondary | ICD-10-CM

## 2013-08-05 DIAGNOSIS — W541XXA Struck by dog, initial encounter: Secondary | ICD-10-CM

## 2013-08-05 DIAGNOSIS — W19XXXA Unspecified fall, initial encounter: Secondary | ICD-10-CM

## 2013-08-05 DIAGNOSIS — M25532 Pain in left wrist: Secondary | ICD-10-CM

## 2013-08-05 DIAGNOSIS — M25529 Pain in unspecified elbow: Secondary | ICD-10-CM

## 2013-08-05 DIAGNOSIS — M25539 Pain in unspecified wrist: Secondary | ICD-10-CM

## 2013-08-05 DIAGNOSIS — W64XXXA Exposure to other animate mechanical forces, initial encounter: Secondary | ICD-10-CM

## 2013-08-05 NOTE — Progress Notes (Signed)
   Subjective:   David Wilkins is a 68 y.o. male presenting on 08/05/2013 with follow up  Here for ED f/u.   Went to ED yesterday after pit bull dog attacked him .   He felt down on buttocks and braced with left arm.  Hurt elbow and wrist.  Went to the ED, had xrays, placed in splint, advised f/u.   He was walking for exercise, has seen this dog before, but dog in the past hasn't bothered him, but for whatever reason dog lunged at him like he was going to attack.  Dog caused him to fall backwards.  He denies head injury or LOC.   He is not sure why, but the dog didn't bite him.   He walked up to him, and about that time the owner opened the door, and the dog backed off.  However, the women went back inside quickly and shut the door.   After this he crossed to other side of the street, sat for a while in pain.  As he was sitting, the other neighbor yelled at him to get out of her yard and called the police.  He ended up calling EMS as he was in pain.  EMS transported him.   He went to police dept this morning to file compliant on the dog, was also advised to talk to animal control.  No other aggravating or relieving factors.  No other complaint.  Review of Systems ROS as in subjective      Objective:    Filed Vitals:   08/05/13 1026  BP: 120/80  Pulse: 78  Temp: 98.4 F (36.9 C)  Resp: 18    General appearance: alert, no distress, WD/WN Head nontender, no obvious trauma Neck: supple, no lymphadenopathy, normal ROM MSK: left wrist in sugar tong splint with ace wrap.   His arm sling is positioned too high that he can't get arm in it.  He is tender over left radial head and elbow, tender over snuff box region and wrist in general, mild pain with wrist ROM.  otherwise arm nontender.   Arms neurovascularly intact Neuro: A&O x3, normal gait        Assessment: Encounter Diagnoses  Name Primary?  . Left wrist pain Yes  . Left elbow pain   . Fall   . Struck by dog      Plan: He  is mainly frustrated that he is left handed and very limited by his arm being in splint and sling.  We discussed the potential nondisplaced fracture of radial neck and need for splinting.  He will c/t splint, NSAID, ice, rest, and f/u with ortho.  Specific recommendations today include:  Use Aleve OTC 1-2 times daily for the next several days  Use the Hydrocodone prescription for pain if needed given by the emergency department  Use ice pack to the left elbow and wrist  Continue arm sling and splint  Make an appointment with Dr. Melvyn Wilkins for within a week  You will need repeat xray within 1 week.  Avoid fall or re injury  Call/return sooner if needed  David Wilkins was seen today for follow up.  Diagnoses and associated orders for this visit:  Left wrist pain  Left elbow pain  Fall  Struck by dog     Return f/u with ortho.

## 2013-08-05 NOTE — Patient Instructions (Signed)
  Thank you for giving me the opportunity to serve you today.    Your diagnosis today includes: Encounter Diagnoses  Name Primary?  . Left wrist pain Yes  . Left elbow pain   . Fall   . Struck by dog      Specific recommendations today include:  Use Aleve OTC 1-2 times daily for the next several days  Use the Hydrocodone prescription for pain if needed given by the emergency department  Use ice pack to the left elbow and wrist  Continue arm sling and splint  Make an appointment with Dr. Melvyn Novasrtmann for within a week  You will need repeat xray within 1 week.  Avoid fall or re injury  Call/return sooner if needed  Return f/u with ortho.

## 2013-09-14 ENCOUNTER — Telehealth: Payer: Self-pay | Admitting: Medical

## 2013-09-14 NOTE — Telephone Encounter (Signed)
Received medical records signed by pt from [employer]. Requesting records from 08/04/2013 to be faxed to [phone]. Attorney info is [employer] [address] [address] [place] [address]. Phone number is [phone]. They are also requesting billing info. I am sending back to [employer], billing coordinator, to handle the billing and then all info will be faxed as per instructions.

## 2013-12-21 ENCOUNTER — Encounter: Payer: Federal, State, Local not specified - PPO | Admitting: Medical

## 2014-08-04 ENCOUNTER — Encounter: Payer: Self-pay | Admitting: Medical

## 2014-08-04 ENCOUNTER — Ambulatory Visit (INDEPENDENT_AMBULATORY_CARE_PROVIDER_SITE_OTHER): Payer: Federal, State, Local not specified - PPO | Admitting: Medical

## 2014-08-04 VITALS — BP 120/70 | HR 78 | Temp 98.5°F | Resp 16 | Wt 196.0 lb

## 2014-08-04 DIAGNOSIS — J309 Allergic rhinitis, unspecified: Secondary | ICD-10-CM | POA: Diagnosis not present

## 2014-08-04 DIAGNOSIS — R42 Dizziness and giddiness: Secondary | ICD-10-CM

## 2014-08-04 DIAGNOSIS — H8110 Benign paroxysmal vertigo, unspecified ear: Secondary | ICD-10-CM | POA: Diagnosis not present

## 2014-08-04 DIAGNOSIS — R94120 Abnormal auditory function study: Secondary | ICD-10-CM

## 2014-08-04 LAB — CBC
HEMATOCRIT: 40.8 % (ref 39.0–52.0)
Hemoglobin: 14.2 g/dL (ref 13.0–17.0)
MCH: 29.7 pg (ref 26.0–34.0)
MCHC: 34.8 g/dL (ref 30.0–36.0)
MCV: 85.4 fL (ref 78.0–100.0)
MPV: 10.7 fL (ref 8.6–12.4)
Platelets: 264 10*3/uL (ref 150–400)
RBC: 4.78 MIL/uL (ref 4.22–5.81)
RDW: 13.8 % (ref 11.5–15.5)
WBC: 5.7 10*3/uL (ref 4.0–10.5)

## 2014-08-04 LAB — BASIC METABOLIC PANEL
BUN: 10 mg/dL (ref 6–23)
CHLORIDE: 100 meq/L (ref 96–112)
CO2: 32 meq/L (ref 19–32)
Calcium: 9.4 mg/dL (ref 8.4–10.5)
Creat: 0.92 mg/dL (ref 0.50–1.35)
GLUCOSE: 91 mg/dL (ref 70–99)
POTASSIUM: 4.6 meq/L (ref 3.5–5.3)
SODIUM: 139 meq/L (ref 135–145)

## 2014-08-04 MED ORDER — MECLIZINE HCL 25 MG PO TABS: 25.0000 mg | ORAL_TABLET | Freq: Two times a day (BID) | ORAL | Status: AC

## 2014-08-04 NOTE — Progress Notes (Signed)
Subjective: Patient presents for evaluation of dizziness. The symptoms started several years ago and have been intermittent. The attacks occur frequently and last minutes to hours. Positions that worsen symptoms: bending over, lying down and standing up. Previous workup/treatments: unsure. Associated ear symptoms: aural pressure. Associated symptoms: allergy symptoms of runny nose, sneezing, congestion. Head trauma: denied. Drug ingestion: anti-motion sickness. Noise exposure: none currently, [place].    Past Medical History  Diagnosis Date  . Depression   . Former smoker   . Chronic cough   . PTSD (post-traumatic stress disorder)    Past Surgical History  Procedure Laterality Date  . No past surgeries      ROS as in subjective  Objective: BP 120/70 mmHg  Pulse 78  Temp(Src) 98.5 F (36.9 C) (Oral)  Resp 16  Wt 196 lb (88.905 kg)  General appearance: alert, no distress, WD/WN, pleasant AA male HEENT: normocephalic, sclerae anicteric, PERRLA, EOMi, TMs with serous effusions mild, nares with turbinates edema and clear discharge, no erythema, pharynx normal Oral cavity: MMM, no lesions Neck: supple, no lymphadenopathy, no thyromegaly, no masses, no bruits  Heart: RRR, normal S1, S2, no murmurs Lungs: CTA bilaterally, no wheezes, rhonchi, or rales Abdomen: +bs, soft, non tender, non distended, no masses, no hepatomegaly, no splenomegaly Extremities: no edema, no cyanosis, no clubbing Pulses: 2+ symmetric, upper and lower extremities, normal cap refill Neurological: alert, oriented x 3, CN2-12 intact, strength normal upper extremities and lower extremities, sensation normal throughout, DTRs 2+ throughout, no cerebellar signs, gait normal Psychiatric: normal affect, behavior normal, pleasant    Assessment: Encounter Diagnoses  Name Primary?  . Dizziness and giddiness Yes  . Abnormal hearing screen   . Benign paroxysmal positional vertigo, unspecified laterality   .  Allergic rhinitis, unspecified allergic rhinitis type     Plan: Labs today, begin meclizine trial, avoid sudden motions, recommended hearing aid consult or ENT consult in general due to hearing screen abnormal.  Consider vestibular rehabilitation.

## 2014-12-18 IMAGING — CR DG ELBOW COMPLETE 3+V*L*
4 series · 4 of 4 positions shown · non-contrast
Comparison: None.

CLINICAL DATA: Fall, elbow pain.  Unable to straighten elbow.

EXAM:
LEFT ELBOW - COMPLETE 3+ VIEW

[view not recorded (1 of 4)]
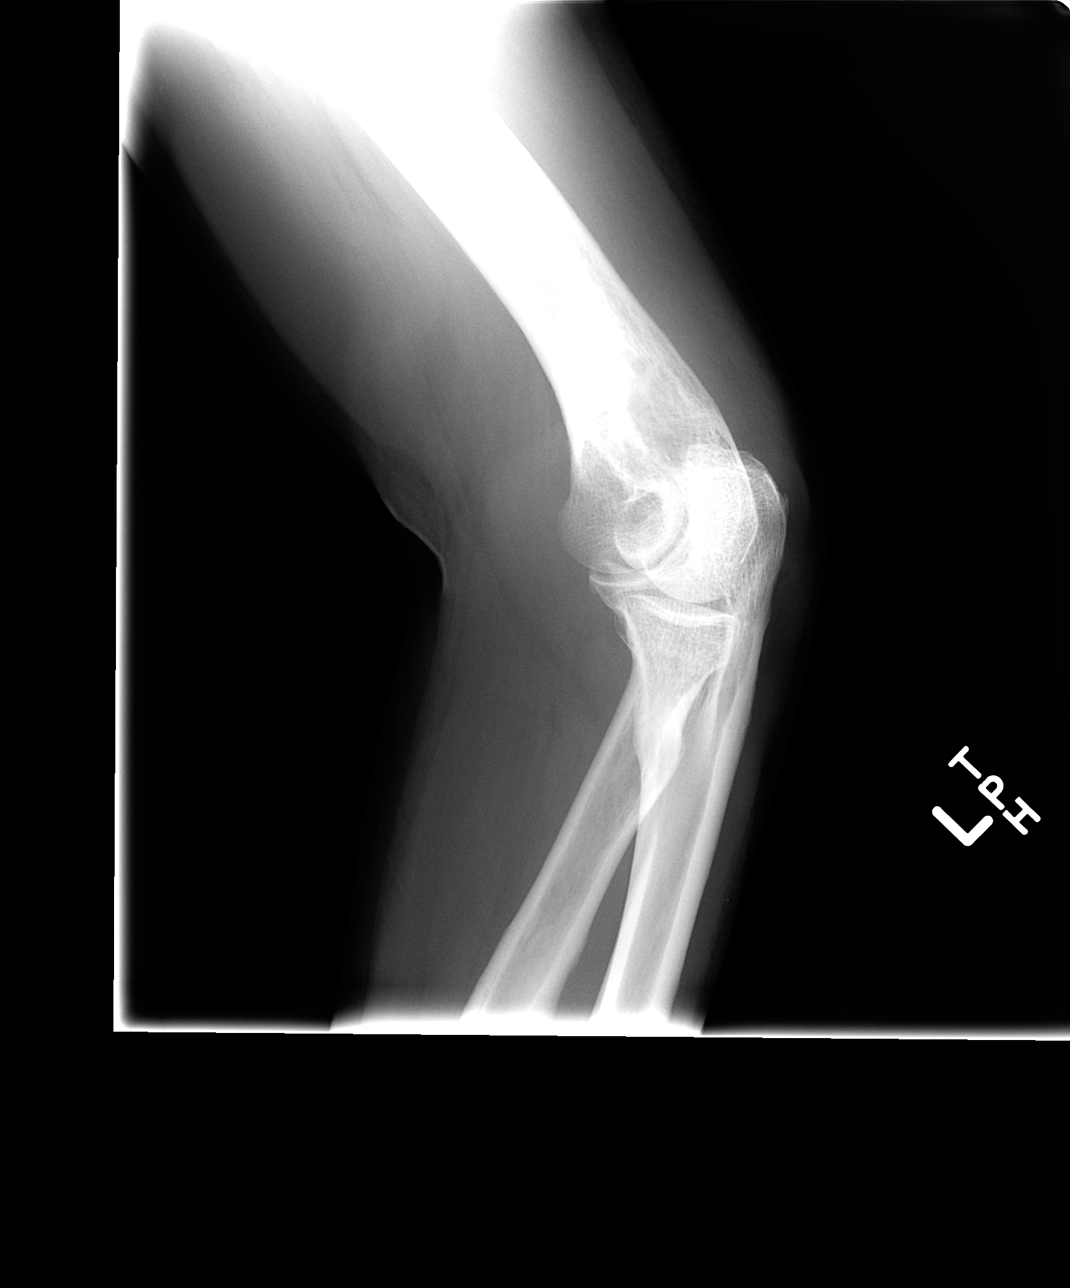

[view not recorded (2 of 4)]
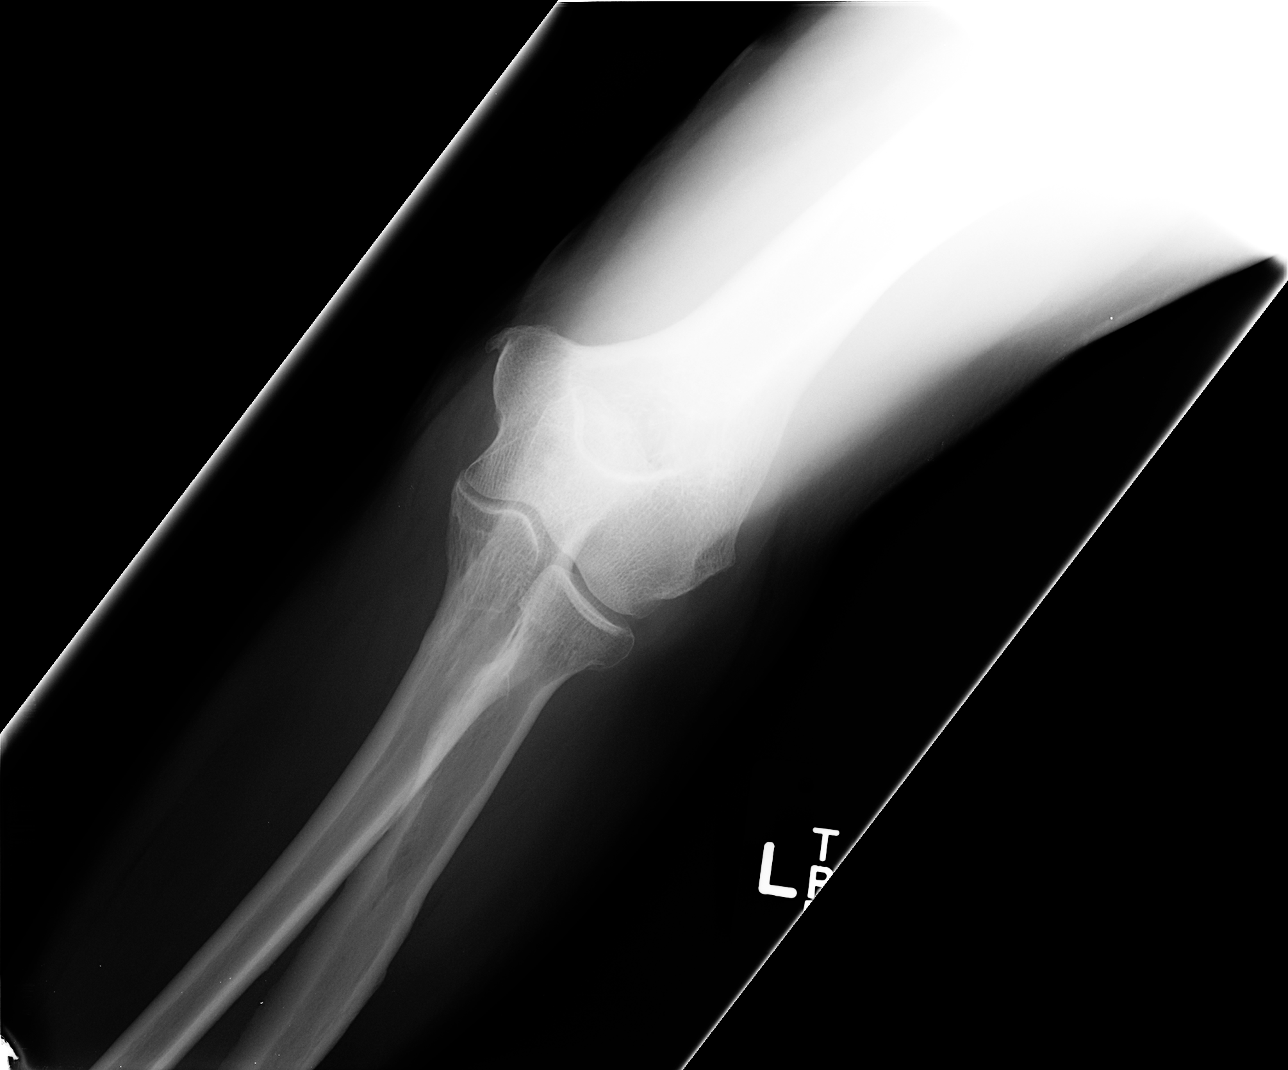

[view not recorded (3 of 4)]
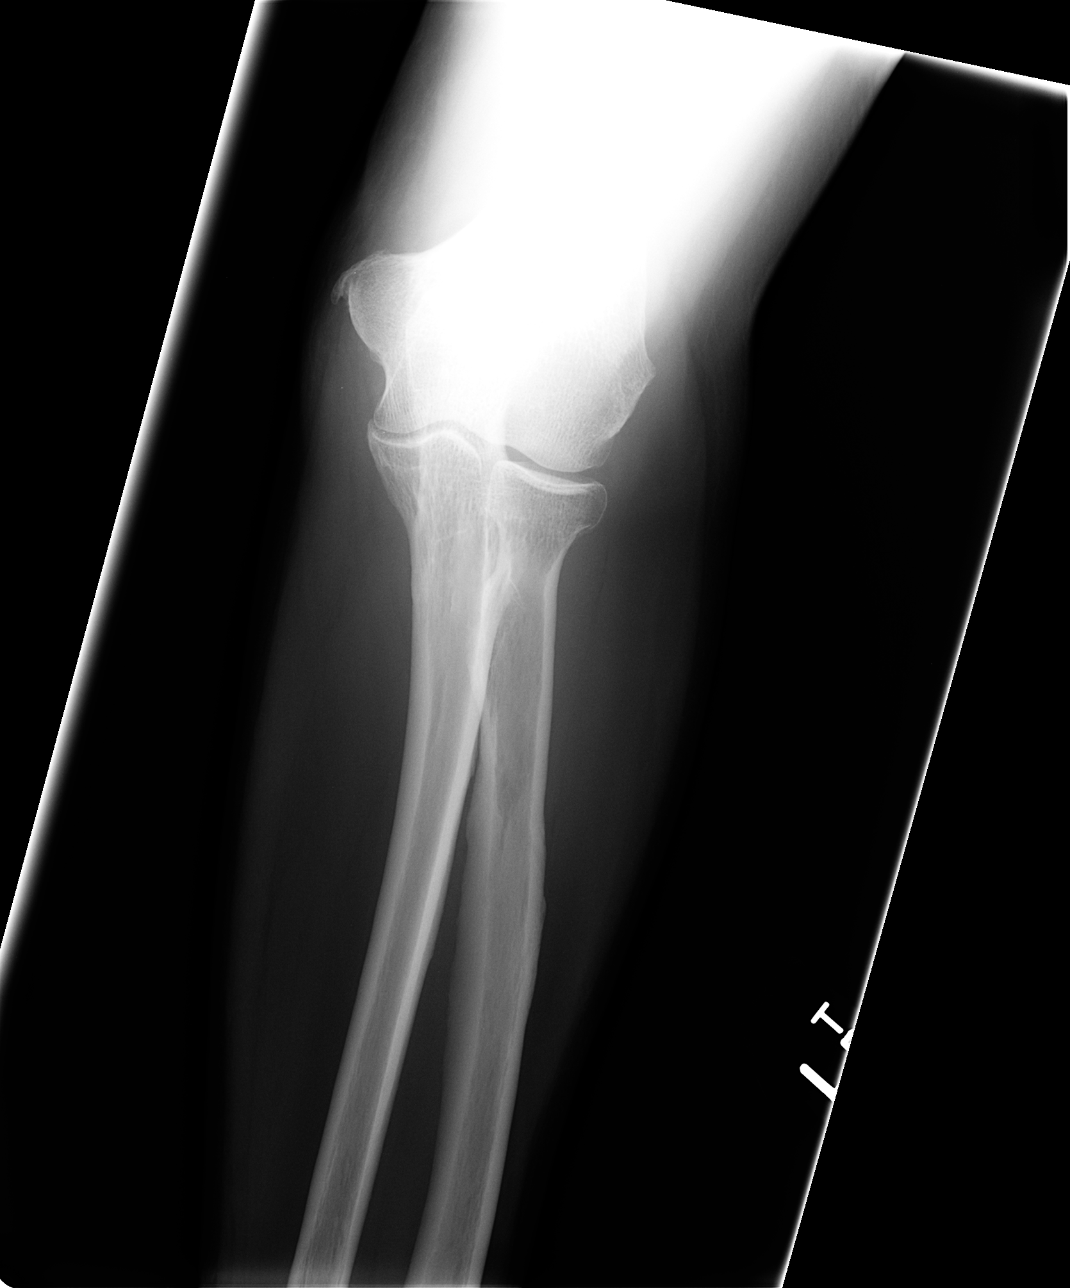

[view not recorded (4 of 4)]
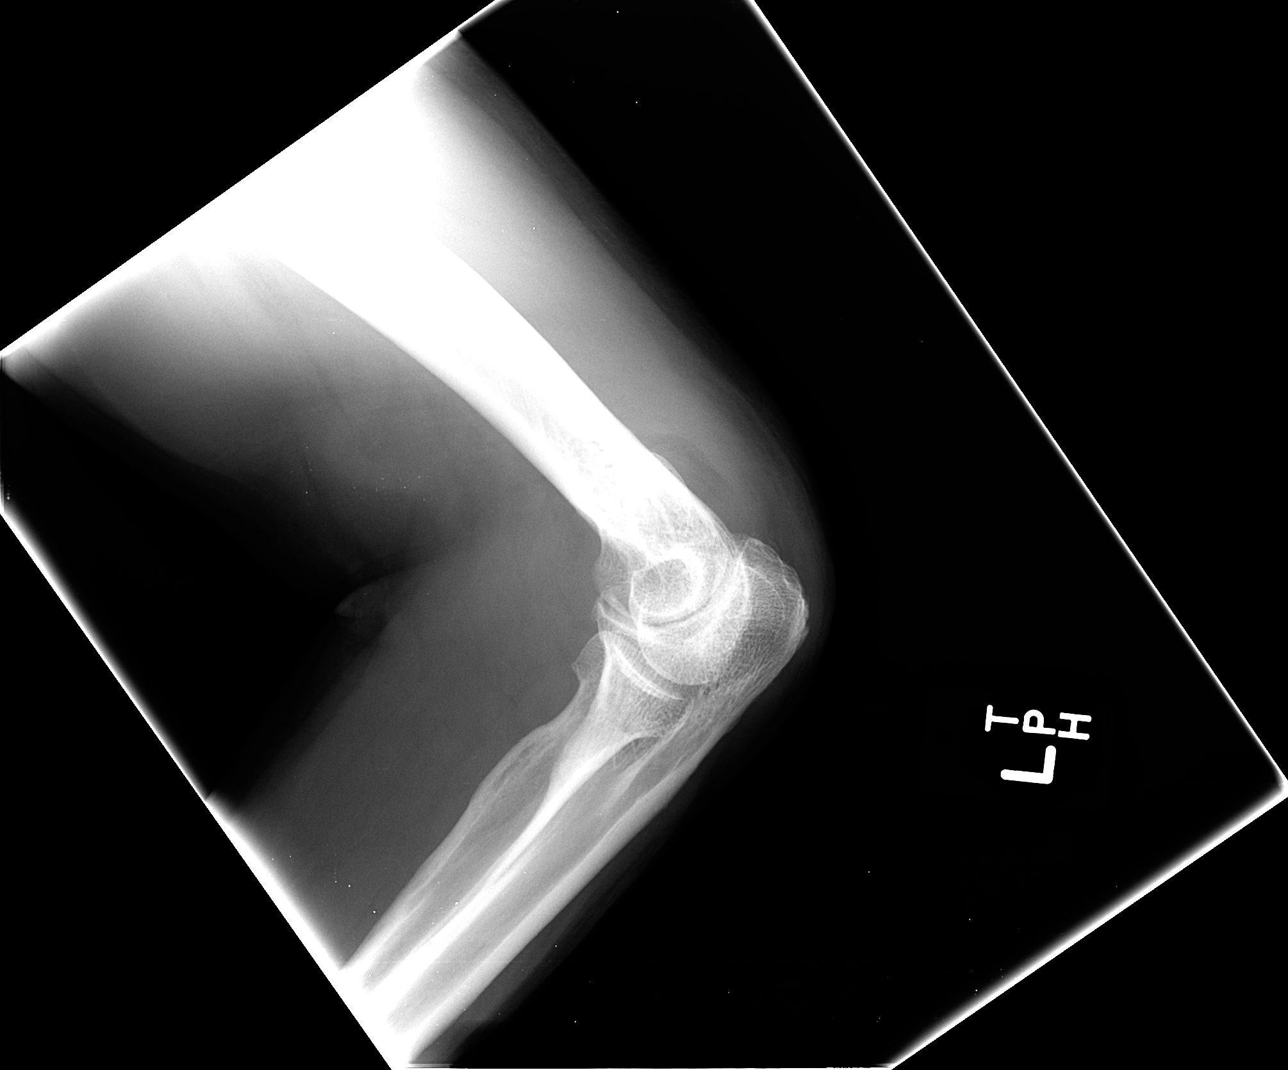

[4 of 4 positions shown; findings below may reference images not displayed]

FINDINGS: There is a joint effusion within the left elbow. Possible cortical
disruption in the region of the radial neck without well-defined
fracture line. Given the joint effusion, findings are suspicious for
nondisplaced radial neck fracture. No additional acute bony
abnormality seen.
IMPRESSION: Left elbow joint effusion with findings suspicious for nondisplaced
radial neck fracture. Consider immobilization and repeat imaging in
1 week.
